# Patient Record
Sex: Female | Born: 1985 | Race: White | Hispanic: No | Marital: Married | Smoking: Never smoker
Health system: Southern US, Community
[De-identification: ages and names within clinical notes are randomized; demographics above are authoritative.]

## PROBLEM LIST (undated history)

## (undated) DIAGNOSIS — C4359 Malignant melanoma of other part of trunk: Secondary | ICD-10-CM

## (undated) DIAGNOSIS — E669 Obesity, unspecified: Secondary | ICD-10-CM

## (undated) DIAGNOSIS — F32A Depression, unspecified: Secondary | ICD-10-CM

## (undated) DIAGNOSIS — F329 Major depressive disorder, single episode, unspecified: Secondary | ICD-10-CM

## (undated) DIAGNOSIS — E079 Disorder of thyroid, unspecified: Secondary | ICD-10-CM

## (undated) DIAGNOSIS — N946 Dysmenorrhea, unspecified: Secondary | ICD-10-CM

## (undated) HISTORY — DX: Major depressive disorder, single episode, unspecified: F32.9

## (undated) HISTORY — DX: Obesity, unspecified: E66.9

## (undated) HISTORY — DX: Depression, unspecified: F32.A

## (undated) HISTORY — DX: Dysmenorrhea, unspecified: N94.6

## (undated) HISTORY — PX: CHOLECYSTECTOMY: SHX55

## (undated) HISTORY — DX: Malignant melanoma of other part of trunk: C43.59

## (undated) HISTORY — PX: MOHS SURGERY: SUR867

## (undated) HISTORY — DX: Disorder of thyroid, unspecified: E07.9

---
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
## 2007-03-29 ENCOUNTER — Ambulatory Visit (HOSPITAL_BASED_OUTPATIENT_CLINIC_OR_DEPARTMENT_OTHER): Admission: RE | Admit: 2007-03-29 | Discharge: 2007-03-29 | Payer: Self-pay | Admitting: Surgery

## 2007-04-04 ENCOUNTER — Ambulatory Visit (HOSPITAL_COMMUNITY): Admission: RE | Admit: 2007-04-04 | Discharge: 2007-04-04 | Payer: Self-pay | Admitting: Surgery

## 2007-04-05 ENCOUNTER — Encounter: Admission: RE | Admit: 2007-04-05 | Discharge: 2007-04-05 | Payer: Self-pay | Admitting: Surgery

## 2007-04-30 ENCOUNTER — Ambulatory Visit: Payer: Self-pay | Admitting: Internal Medicine

## 2007-08-22 ENCOUNTER — Other Ambulatory Visit: Admission: RE | Admit: 2007-08-22 | Discharge: 2007-08-22 | Payer: Self-pay | Admitting: Internal Medicine

## 2008-02-14 ENCOUNTER — Ambulatory Visit: Payer: Self-pay | Admitting: Internal Medicine

## 2008-06-05 ENCOUNTER — Ambulatory Visit: Payer: Self-pay | Admitting: Internal Medicine

## 2008-08-23 ENCOUNTER — Ambulatory Visit: Payer: Self-pay | Admitting: Internal Medicine

## 2008-08-23 ENCOUNTER — Other Ambulatory Visit: Admission: RE | Admit: 2008-08-23 | Discharge: 2008-08-23 | Payer: Self-pay | Admitting: Internal Medicine

## 2008-09-21 ENCOUNTER — Ambulatory Visit: Payer: Self-pay | Admitting: Internal Medicine

## 2009-04-09 ENCOUNTER — Ambulatory Visit: Payer: Self-pay | Admitting: Internal Medicine

## 2009-05-13 ENCOUNTER — Ambulatory Visit: Payer: Self-pay | Admitting: Internal Medicine

## 2009-10-03 ENCOUNTER — Ambulatory Visit: Payer: Self-pay | Admitting: Internal Medicine

## 2009-10-03 ENCOUNTER — Other Ambulatory Visit: Admission: RE | Admit: 2009-10-03 | Discharge: 2009-10-03 | Payer: Self-pay | Admitting: Internal Medicine

## 2009-12-19 ENCOUNTER — Ambulatory Visit: Payer: Self-pay | Admitting: Internal Medicine

## 2010-04-03 ENCOUNTER — Ambulatory Visit: Payer: Self-pay | Admitting: Internal Medicine

## 2010-05-05 ENCOUNTER — Ambulatory Visit
Admission: RE | Admit: 2010-05-05 | Discharge: 2010-05-05 | Payer: Self-pay | Source: Home / Self Care | Attending: Internal Medicine | Admitting: Internal Medicine

## 2010-05-05 ENCOUNTER — Ambulatory Visit: Admit: 2010-05-05 | Payer: Self-pay | Admitting: Internal Medicine

## 2010-05-17 ENCOUNTER — Encounter: Payer: Self-pay | Admitting: Internal Medicine

## 2010-09-09 NOTE — Procedures (Signed)
NAME:  Wendy Terrell, Wendy Terrell NO.:  [id number]   MEDICAL RECORD NO.:  [id number]          PATIENT TYPE:  OUT   LOCATION:  SLEEP CENTER                 FACILITY:  [facility]   PHYSICIAN:  Clinton D. Maple Hudson, MD, FCCP, FACPDATE OF BIRTH:  06-30[birth year]1987   DATE OF STUDY:  03/29/2007                            NOCTURNAL POLYSOMNOGRAM   REFERRING PHYSICIAN:  Thornton Park. Daphine Deutscher, MD   INDICATION FOR STUDY:  Hypersomnia with sleep apnea.  BMI 47, weight 311  pounds, height 68 inches, neck 15 inches.   EPWORTH SLEEPINESS SCORE:  12/24   MEDICATIONS:  Home medications listed and reviewed.  No bedtime  medication was taken.   SLEEP ARCHITECTURE:  Total sleep time 393 minutes with sleep efficiency  91%.  Stage I was 8%, stage II 67%, stage III 10%, REM 13% of total  sleep time.  Sleep latency 9 minutes, REM latency 217 minutes.  Awake  after sleep onset 25 minutes.  Arousal index 12.8.   RESPIRATORY DATA:  Diagnostic NPSG protocol requested.  Apnea/hypopnea  index (AHI) 1.5 obstructive events per hour which is normal.  Events  included 1 central apnea, 9 hypopneas.  Events were nonpositional with  REM AHI 2.3.  Respiratory disturbance index (RDI) counting all events  impacting sleep was 6.9 which may reflect mild obstructive sleep  apnea/hypopnea syndrome (normal range 0-5 per hour).   OXYGEN DATA:  Moderate snoring with oxygen desaturation to a nadir of  87%.  Mean oxygen saturation through the study was 95.9% on room air.   CARDIAC DATA:  Normal sinus rhythm.   MOVEMENT-PARASOMNIA:  No significant limb movement disturbance.  No  bathroom trips.   IMPRESSIONS-RECOMMENDATIONS:  1. Minimal obstructive sleep apnea/hypopnea syndrome based on      respiratory disturbance index (RDI) 6.9 per hour (normal range 0-5      per hour).  AHI was 1.5 per hour.  Moderately loud snoring with      oxygen desaturation to a nadir of 87%.  2. Scores in this range are not usually addressed with CPAP.   If      necessary at time of surgery, suggest either an autotitration      system or empiric CPAP pressure at 10 CWP.      Clinton D. Maple Hudson, MD, [facility], FACP  Diplomate, Biomedical engineer of Sleep Medicine  Electronically Signed     CDY/MEDQ  D:  04/03/2007 15:33:26  T:  04/04/2007 00:14:54  Job:  [id number]

## 2010-09-12 NOTE — Op Note (Signed)
NAME:  TELINA, KLECKLEY NO.:  [id number]   MEDICAL RECORD NO.:  [id number]          PATIENT TYPE:  AMB   LOCATION:  DAY                          FACILITY:  [facility]   PHYSICIAN:  Adolph Pollack, M.D.DATE OF BIRTH:  December 17, 1985   DATE OF PROCEDURE:  01/25/2006  DATE OF DISCHARGE:                                 OPERATIVE REPORT   PREOP DIAGNOSIS:  Symptomatic cholelithiasis.   POSTOPERATIVE DIAGNOSIS:  Symptomatic cholelithiasis.   PROCEDURE:  Laparoscopic cholecystectomy with intraoperative cholangiogram.   SURGEON:  Adolph Pollack, M.D.   ASSISTANT:  Sheppard Plumber. Earlene Plater, M.D.   ANESTHESIA:  General.   INDICATIONS:  This 25 year old female has been having episodic epigastric  pain radiating to the back with nausea and vomiting.  This tends to be  exacerbated by a greasy or spicy meal.  Liver function tests are normal.  She has cholelithiasis and a HIDA scan that did not show filling of the  gallbladder.  She now presents for elective laparoscopic cholecystectomy.  We have gone over the procedure and the risks and aftercare preoperatively.   TECHNIQUE:  She was seen in the holding area, then brought to the operating  room, placed supine on the operating table and a general anesthetic was  administered.  The abdominal wall was sterilely prepped and draped.  A  supraumbilical small incision was made through the skin, subcutaneous  tissue, fascia, and peritoneum.  A pursestring suture of #0 Vicryl was  placed around the fascial edges.  A Hassan trocar was introduced into the  peritoneal cavity; and a pneumoperitoneum was created by insufflation of CO2  gas.   Following this a laparoscope was introduced.  The liver looked within normal  limits.  She was placed in the reverse Trendelenburg position with the right  side tilted slightly up.  An 11-mm trocar was placed through an epigastric  incision; and two 5-mm trocars were placed in the right mid lateral  abdomen.  The fundus of the gallbladder was grasped and retracted toward the right  shoulder.  The infundibulum was grasped and using blunt dissection it was  mobilized.  I then isolated the cystic duct and created a window around it.  A clip was placed at the cystic duct gallbladder junction; and a small  incision made in the cystic duct; and bile was milked back.  A  cholangiocatheter was passed through the anterior abdominal wall and placed  into the cystic duct.  Following this a cholangiogram was performed.   Under real time fluoroscopy dilute contrast material was injected into the  cystic duct.  The common bile duct filled promptly and drained promptly.  The common hepatic duct was seen as well as the initial bifurcation; and no  obvious obstructing lesions were noted.   Following this the cholangiocatheter was removed, the cystic duct was  clipped three times proximally and divided.  An anterior branch of the  cystic artery was identified close to the gallbladder; and a window created  around it.  It was clipped and divided.  The posterior branch of the cystic  artery  was identified, clipped, and divided.  The gallbladder was then  dissected free from the liver using electrocautery.  A small amount of bile  spillage occurred, but no stones were spilled.  This was placed in an  Endopouch bag.   I then copiously irrigated out the gallbladder fossa.  Bleeding points were  controlled with electrocautery.  At this point no bleeding or bile leak was  noted.  I then removed the gallbladder in the Endopouch bag through the  supraumbilical port and closed the supraumbilical fascial defect under  laparoscopic vision by tightening up and tying down the pursestring suture.  The perihepatic irrigation fluid was evacuated.  The remaining trocars were  removed; and the pneumoperitoneum was released.   The skin incisions were closed with 4-0 Monocryl subcuticular stitches  followed by  Steri-Strips and sterile dressings.  She tolerated the procedure  without apparent complications; and was taken to recovery in satisfactory  condition.      Adolph Pollack, M.D.  Electronically Signed     TJR/MEDQ  D:  01/25/2006  T:  01/26/2006  Job:  [id number]

## 2010-11-07 ENCOUNTER — Encounter: Payer: Self-pay | Admitting: Internal Medicine

## 2010-11-11 ENCOUNTER — Ambulatory Visit (INDEPENDENT_AMBULATORY_CARE_PROVIDER_SITE_OTHER): Payer: BC Managed Care – PPO | Admitting: Internal Medicine

## 2010-11-11 ENCOUNTER — Encounter: Payer: Self-pay | Admitting: Internal Medicine

## 2010-11-11 VITALS — BP 109/64 | HR 68 | Temp 98.7°F | Ht 66.0 in | Wt 298.0 lb

## 2010-11-11 DIAGNOSIS — E611 Iron deficiency: Secondary | ICD-10-CM

## 2010-11-11 DIAGNOSIS — I871 Compression of vein: Secondary | ICD-10-CM

## 2010-11-11 DIAGNOSIS — E119 Type 2 diabetes mellitus without complications: Secondary | ICD-10-CM

## 2010-11-11 DIAGNOSIS — E669 Obesity, unspecified: Secondary | ICD-10-CM

## 2010-11-11 DIAGNOSIS — E039 Hypothyroidism, unspecified: Secondary | ICD-10-CM

## 2010-11-11 LAB — TSH: TSH: 4.795 u[IU]/mL — ABNORMAL HIGH (ref 0.350–4.500)

## 2010-11-11 NOTE — Patient Instructions (Signed)
Take iron sulfate 325 mg twice daily for mild iron deficiency. Continue with thyroid replacement therapy.

## 2010-11-11 NOTE — Progress Notes (Signed)
  Subjective:    Patient ID: Wendy Terrell, female    DOB: 08-02-1985, 25 y.o.   MRN: [id number]  HPI  patient in today for followup of hypothyroidism. Is on Synthroid 0.05 mg daily. She has a new baby boy. She is not breast-feeding. She  had a C-section after being induced for post dates. Has not been able to lose much weight since having the baby. At one point she was considering having bariatric surgery but has put that on hold for now. Says that prior to becoming pregnant, she did Weight Watchers and lost a good deal of weight. She brings in some lab work from the obstetrician that indicate she has low normal iron level. Says she has heavy menses. Have recommended iron sulfate 325 mg twice daily. She is not anemic and her MCV is normal. Total iron level was 51. Hemoglobin was 12.1 g. Iron-binding capacity was normal. Ferritin was slightly low at 12 normal being between 13 and 150. Says that she had considerable amount of dependent edema after giving birth but finally went away with Lasix after a couple of months. Strong family history of diabetes. Never had pregnancy-induced hypertension nor glucose intolerance during her pregnancy.    Review of Systems     Objective:   Physical Exam  neck is supple without thyromegaly; chest clear; cardiac exam regular rate and rhythm; extremities without edema.        Assessment & Plan:  Hypothyroidism  Obesity  History of dependent edema status post delivery  TSH drawn today, screening hemoglobin A1c with strong family history of diabetes and obesity, check basic metabolic panel with history of edema. Return in 6 months for followup on hypothyroidism and any other issues. Says she's mildly depressed about her weight but not on any antidepressant therapy at this point in time. Now has IUD for birth control

## 2010-11-12 LAB — BASIC METABOLIC PANEL
BUN: 17 mg/dL (ref 6–23)
CO2: 26 mEq/L (ref 19–32)
Calcium: 9.2 mg/dL (ref 8.4–10.5)
Chloride: 104 mEq/L (ref 96–112)
Creat: 0.62 mg/dL (ref 0.50–1.10)
Glucose, Bld: 63 mg/dL — ABNORMAL LOW (ref 70–99)
Potassium: 4.3 mEq/L (ref 3.5–5.3)
Sodium: 141 mEq/L (ref 135–145)

## 2010-11-12 LAB — HEMOGLOBIN A1C
Hgb A1c MFr Bld: 5.1 % (ref <5.7)
Mean Plasma Glucose: 100 mg/dL (ref <117)

## 2010-11-14 ENCOUNTER — Encounter: Payer: Self-pay | Admitting: Internal Medicine

## 2011-01-22 ENCOUNTER — Ambulatory Visit: Payer: BC Managed Care – PPO | Admitting: Internal Medicine

## 2011-02-12 ENCOUNTER — Ambulatory Visit: Payer: BC Managed Care – PPO | Admitting: Internal Medicine

## 2011-05-18 ENCOUNTER — Ambulatory Visit (INDEPENDENT_AMBULATORY_CARE_PROVIDER_SITE_OTHER): Payer: BC Managed Care – PPO | Admitting: Internal Medicine

## 2011-05-18 ENCOUNTER — Encounter: Payer: Self-pay | Admitting: Internal Medicine

## 2011-05-18 VITALS — BP 136/82 | HR 76 | Temp 98.1°F | Ht 67.0 in | Wt 326.0 lb

## 2011-05-18 DIAGNOSIS — Z23 Encounter for immunization: Secondary | ICD-10-CM

## 2011-05-18 DIAGNOSIS — E039 Hypothyroidism, unspecified: Secondary | ICD-10-CM

## 2011-05-18 DIAGNOSIS — F4321 Adjustment disorder with depressed mood: Secondary | ICD-10-CM

## 2011-05-18 DIAGNOSIS — F32A Depression, unspecified: Secondary | ICD-10-CM | POA: Insufficient documentation

## 2011-05-18 DIAGNOSIS — F329 Major depressive disorder, single episode, unspecified: Secondary | ICD-10-CM

## 2011-05-18 LAB — TSH: TSH: 3.091 u[IU]/mL (ref 0.350–4.500)

## 2011-05-18 NOTE — Patient Instructions (Signed)
TSH has been checked today. We will notify you with regard to current dose of Synthroid. Continue Wellbutrin for depression as prescribed by GYN. Consider grief counseling. Return in 6 months for physical exam without Pap smear and pelvic exam.

## 2011-05-18 NOTE — Progress Notes (Signed)
  Subjective:    Patient ID: Wendy Terrell, female    DOB: 1986/04/25, 26 y.o.   MRN: [id number]  HPI 26 year old morbidly obese white female with hypothyroidism in today for six-month recheck. TSH drawn. She lost her father a couple of weeks ago due to complications from heart surgery. He had a history of Hodgkin's disease as a young man and apparently had developed heart disease and valve issues. He had a rocky course in the hospital and patient says he was taken off life support by his wife. Patient has a 10-month-old son. Says she had some postpartum depression issues and gynecologist in [place] started her on Wellbutrin XL 300 mg daily recently. May have some grief issues that she has not resolved. Says half siblings are fighting with her stepmother over father's estate. She never had a great relationship with her father. She said he was frequently disappointed with her especially her weight. She has considered bariatric surgery in the past but has not done that. She became pregnant soon after she considered that. Did not take influenza immunization this season. Tdap was given today.    Review of Systems     Objective:   Physical Exam no thyromegaly; chest clear; cardiac exam regular rate and rhythm        Assessment & Plan:  Hypothyroidism  Depression  Grief reaction  Morbid obesity weight greater than 300 pounds  Plan: Return in 6 months for PE and fasting labs without Pap smear. Tdap given today. Has gynecologist in [place]. Consider grief counseling.

## 2011-07-12 ENCOUNTER — Other Ambulatory Visit: Payer: Self-pay | Admitting: Internal Medicine

## 2011-11-16 ENCOUNTER — Ambulatory Visit (INDEPENDENT_AMBULATORY_CARE_PROVIDER_SITE_OTHER): Payer: BC Managed Care – PPO | Admitting: Internal Medicine

## 2011-11-16 ENCOUNTER — Encounter: Payer: Self-pay | Admitting: Internal Medicine

## 2011-11-16 VITALS — BP 108/72 | HR 72 | Temp 97.1°F | Ht 66.0 in | Wt 333.5 lb

## 2011-11-16 DIAGNOSIS — Z131 Encounter for screening for diabetes mellitus: Secondary | ICD-10-CM

## 2011-11-16 DIAGNOSIS — E039 Hypothyroidism, unspecified: Secondary | ICD-10-CM

## 2011-11-16 DIAGNOSIS — Z7182 Exercise counseling: Secondary | ICD-10-CM

## 2011-11-16 DIAGNOSIS — Z8659 Personal history of other mental and behavioral disorders: Secondary | ICD-10-CM

## 2011-11-16 LAB — HEMOGLOBIN A1C
Hgb A1c MFr Bld: 5.2 % (ref <5.7)
Mean Plasma Glucose: 103 mg/dL (ref <117)

## 2011-11-16 LAB — TSH: TSH: 1.272 u[IU]/mL (ref 0.350–4.500)

## 2011-11-16 NOTE — Patient Instructions (Addendum)
Decrease calories to 1800 daily. Make sure you're well hydrated during hot weather, in particular before starting a run. Return in 6 months for physical examination.

## 2011-11-16 NOTE — Progress Notes (Signed)
  Subjective:    Patient ID: Wendy Terrell, female    DOB: [birth year], 26 y.o.   MRN: [id number]  HPI 26 year old morbidly obese white female in today for followup of hypothyroidism, depression, and obesity. Unfortunately she's gained about 7 pounds since her last visit. Recently she started running. She's training for [employer] marathon in 2023/04/23. Her father passed away of complications from valve replacement surgery at [facility]. He became ill suddenly but had a history of Hodgkin's disease and had chemotherapy with radiation therapy in college. Doctors at [facility] told her that his heart condition was likely related to that treatment years ago. Patient has had some issues surrounding not getting much attention from her father over the years. She seems much more at peace with that nail. Says running makes her feel free and she's excited about this. Says everything is good. She's come off antidepressant therapy. She was surprised to learn that she gained some weight. Recently she was running and felt some spots in front of her eyes. Her mother took her blood sugar and it was a bit low but she had very little to a 10 day. Explained to her it would be important to have enough calories before running, protein intake that is adequate and stay well-hydrated particularly in this hot humid weather. We have agreed to reassess her weight in 6 months. She is currently consuming 2400 calories. I think that may be a bit too much for her metabolism. Have asked her to reduce it to 1800 calories. She's asking about a supplement someone is selling containing high doses of B12 and B6. It also contains 120 mg of caffeine. Explained to her that caffeine can cause some fluid retention. She says that this gives her energy to run. Would prefer that she not take the supplement. Says she is walking 3.1 miles on the treadmill daily. This is most excited upsetting her about anything in a number of years. Her son is now 64 months  old and she says he is quite active.    Review of Systems     Objective:   Physical Exam neck is supple without thyromegaly; skin is warm and dry; chest clear to auscultation; cardiac exam regular rate and rhythm normal S1 and S2; extremities without edema. Affect is normal and actually brighter than I have seen in the past.        Assessment & Plan:  Morbid obesity  Hypothyroidism  History of depression- now off antidepressant therapy  Plan: Patient will return in 6 months for physical examination. TSH and hemoglobin A1c drawn today. There is a strong family history of diabetes mellitus in her mother, her uncle, her maternal grandparents

## 2012-05-06 ENCOUNTER — Other Ambulatory Visit: Payer: BC Managed Care – PPO | Admitting: Internal Medicine

## 2012-05-06 DIAGNOSIS — E039 Hypothyroidism, unspecified: Secondary | ICD-10-CM

## 2012-05-06 DIAGNOSIS — E669 Obesity, unspecified: Secondary | ICD-10-CM

## 2012-05-06 DIAGNOSIS — Z0000 Encounter for general adult medical examination without abnormal findings: Secondary | ICD-10-CM

## 2012-05-06 LAB — CBC WITH DIFFERENTIAL/PLATELET
Basophils Absolute: 0 K/uL (ref 0.0–0.1)
Basophils Relative: 0 % (ref 0–1)
Eosinophils Absolute: 0.1 K/uL (ref 0.0–0.7)
Eosinophils Relative: 1 % (ref 0–5)
HCT: 40.7 % (ref 36.0–46.0)
Hemoglobin: 14 g/dL (ref 12.0–15.0)
Lymphocytes Relative: 23 % (ref 12–46)
Lymphs Abs: 1.6 K/uL (ref 0.7–4.0)
MCH: 28.5 pg (ref 26.0–34.0)
MCHC: 34.4 g/dL (ref 30.0–36.0)
MCV: 82.7 fL (ref 78.0–100.0)
Monocytes Absolute: 0.5 K/uL (ref 0.1–1.0)
Monocytes Relative: 7 % (ref 3–12)
Neutro Abs: 4.9 K/uL (ref 1.7–7.7)
Neutrophils Relative %: 69 % (ref 43–77)
Platelets: 224 K/uL (ref 150–400)
RBC: 4.92 MIL/uL (ref 3.87–5.11)
RDW: 13.4 % (ref 11.5–15.5)
WBC: 7.1 K/uL (ref 4.0–10.5)

## 2012-05-06 LAB — COMPREHENSIVE METABOLIC PANEL
ALT: 12 U/L (ref 0–35)
AST: 13 U/L (ref 0–37)
Albumin: 4.2 g/dL (ref 3.5–5.2)
Alkaline Phosphatase: 88 U/L (ref 39–117)
BUN: 9 mg/dL (ref 6–23)
CO2: 28 mEq/L (ref 19–32)
Calcium: 9 mg/dL (ref 8.4–10.5)
Chloride: 103 mEq/L (ref 96–112)
Creat: 0.61 mg/dL (ref 0.50–1.10)
Glucose, Bld: 75 mg/dL (ref 70–99)
Potassium: 4.3 mEq/L (ref 3.5–5.3)
Sodium: 139 mEq/L (ref 135–145)
Total Bilirubin: 0.4 mg/dL (ref 0.3–1.2)
Total Protein: 6.7 g/dL (ref 6.0–8.3)

## 2012-05-06 LAB — LIPID PANEL
Cholesterol: 171 mg/dL (ref 0–200)
HDL: 45 mg/dL
LDL Cholesterol: 114 mg/dL — ABNORMAL HIGH (ref 0–99)
Total CHOL/HDL Ratio: 3.8 ratio
Triglycerides: 61 mg/dL
VLDL: 12 mg/dL (ref 0–40)

## 2012-05-06 LAB — TSH: TSH: 3.564 u[IU]/mL (ref 0.350–4.500)

## 2012-05-07 LAB — VITAMIN D 25 HYDROXY (VIT D DEFICIENCY, FRACTURES): Vit D, 25-Hydroxy: 30 ng/mL (ref 30–89)

## 2012-05-09 ENCOUNTER — Other Ambulatory Visit: Payer: BC Managed Care – PPO | Admitting: Internal Medicine

## 2012-05-10 ENCOUNTER — Encounter: Payer: Self-pay | Admitting: Internal Medicine

## 2012-05-10 ENCOUNTER — Ambulatory Visit (INDEPENDENT_AMBULATORY_CARE_PROVIDER_SITE_OTHER): Payer: BC Managed Care – PPO | Admitting: Internal Medicine

## 2012-05-10 VITALS — BP 104/82 | HR 64 | Temp 98.5°F | Ht 68.0 in | Wt 329.0 lb

## 2012-05-10 DIAGNOSIS — E669 Obesity, unspecified: Secondary | ICD-10-CM

## 2012-05-10 DIAGNOSIS — R632 Polyphagia: Secondary | ICD-10-CM

## 2012-05-10 DIAGNOSIS — F509 Eating disorder, unspecified: Secondary | ICD-10-CM

## 2012-05-10 DIAGNOSIS — Z0000 Encounter for general adult medical examination without abnormal findings: Secondary | ICD-10-CM

## 2012-05-10 DIAGNOSIS — E039 Hypothyroidism, unspecified: Secondary | ICD-10-CM

## 2012-05-10 DIAGNOSIS — F329 Major depressive disorder, single episode, unspecified: Secondary | ICD-10-CM

## 2012-05-10 DIAGNOSIS — K5900 Constipation, unspecified: Secondary | ICD-10-CM

## 2012-05-10 LAB — POCT URINALYSIS DIPSTICK
Bilirubin, UA: NEGATIVE
Blood, UA: NEGATIVE
Glucose, UA: NEGATIVE
Ketones, UA: NEGATIVE
Leukocytes, UA: NEGATIVE
Nitrite, UA: NEGATIVE
Protein, UA: NEGATIVE
Spec Grav, UA: 1.01
Urobilinogen, UA: NEGATIVE
pH, UA: 7.5

## 2012-05-10 NOTE — Progress Notes (Signed)
Subjective:    Patient ID: Wendy Terrell, female    DOB: 08-Mar-1986, 27 y.o.   MRN: [id number]  HPI 27 year old [place] female with history of binge eating but not purging currently going to counseling for that weekly here in [place]. Any emotion seems to trigger that then feels bad about self. It could anger, lonliness, boredom triggering the overeating. Might eat a whole pizza or bag of potato chips. Frustration with husband triggers the eating. 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 Need to obtain path report.  Family history: Father passed away from complications of heart disease and had valve replacement complications. He had had Hodgkin's disease at an early age. Apparently also had history of melanoma on his back. Patient says half-sister has history of melanoma on her back. Mother with history of hypothyroidism and obesity. Mother has diabetes mellitus.  Maternal uncle with diabetes mellitus. Maternal grandparents with diabetes mellitus and hypertension.  Social history: She is married. 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 Has a nearly 40-year-old son. High school graduate. Attended [employer] but did not graduate. Long-standing history of obesity. Has considered bariatric surgery several years ago.  Has GYN physician in [place]. Currently has an IUD Mirena but will be changing to a ParaGard IUD without hormone in the near future. She is hoping that that will help her weight loss.  Does not smoke.    Review of Systems  Constitutional: Positive for fatigue.  HENT: Negative.   Eyes: Negative.   Respiratory: Negative.   Cardiovascular: Negative.   Gastrointestinal:       Constipation  Genitourinary: Negative.   Musculoskeletal: Negative.   Neurological: Negative.   Hematological: Negative.   Psychiatric/Behavioral: Positive for dysphoric mood.       Objective:   Physical Exam  Vitals  reviewed. Constitutional: She is oriented to person, place, and time. She appears well-developed and well-nourished. No distress.  HENT:  Head: Normocephalic and atraumatic.  Right Ear: External ear normal.  Left Ear: External ear normal.  Nose: Nose normal.  Mouth/Throat: Oropharynx is clear and moist. No oropharyngeal exudate.  Eyes: Conjunctivae normal and EOM are normal. Pupils are equal, round, and reactive to light. No scleral icterus.  Neck: Neck supple. No JVD present. No thyromegaly present.  Cardiovascular: Normal rate, regular rhythm, normal heart sounds and intact distal pulses.   No murmur heard. Pulmonary/Chest: Effort normal and breath sounds normal. No respiratory distress. She has no wheezes. She has no rales.       Breasts normal female  Abdominal: Soft. Bowel sounds are normal. She exhibits no distension and no mass. There is no tenderness. There is no rebound and no guarding.  Genitourinary:       Dr. Cathie Beams in [place] at [facility]. Pap has been normal has Mirena but going to [place] soon to see if no hormone IUD helps weight loss.  Musculoskeletal: She exhibits no edema.  Lymphadenopathy:    She has no cervical adenopathy.  Neurological: She is alert and oriented to person, place, and time. She has normal reflexes. No cranial nerve deficit. Coordination normal.  Skin: Skin is warm and dry. She is not diaphoretic.       Scar on mid back status post excision of possible melanoma several years ago  Psychiatric: She has a normal mood and affect. Her behavior is normal. Judgment and thought content normal.  Assessment & Plan:  Depression-change Wellbutrin SR 200 mg which she's been taking daily instead of twice a day to Wellbutrin XL 300 mg daily  Hypothyroidism-TSH a bit elevated. Change Synthroid to 0.1 mg daily and recheck in 6 months  Obesity-encouraged diet exercise and weight loss  Possible eating disorder-continue  counseling  Patient relates history of melanoma on her back being removed by Dr. Irene Limbo at [facility]. This was several years ago. We do not have path  Report. Obtain path report. She is  seen yearly there for mole check.  Recent report of constipation which improved with increasing water intake daily.  Plan: Return in 6 months for office visit and TSH on increased dose of Synthroid a new dose of Wellbutrin.

## 2012-05-10 NOTE — Patient Instructions (Addendum)
Increase Synthroid to 0.1 mg daily and recheck in 6 months. Change Wellbutrin XL to 300 mg daily. Continue to see counselor.

## 2012-08-24 ENCOUNTER — Other Ambulatory Visit: Payer: Self-pay | Admitting: Dermatology

## 2012-09-27 ENCOUNTER — Other Ambulatory Visit: Payer: Self-pay | Admitting: Dermatology

## 2012-09-27 ENCOUNTER — Other Ambulatory Visit: Payer: Self-pay

## 2012-09-27 MED ORDER — SYNTHROID 75 MCG PO TABS: 75.0000 ug | ORAL_TABLET | Freq: Every day | ORAL | Status: DC

## 2012-09-27 MED ORDER — BUPROPION HCL ER (XL) 300 MG PO TB24: 300.0000 mg | ORAL_TABLET | Freq: Every day | ORAL | Status: DC

## 2012-11-03 ENCOUNTER — Other Ambulatory Visit: Payer: BC Managed Care – PPO | Admitting: Internal Medicine

## 2012-11-03 DIAGNOSIS — E039 Hypothyroidism, unspecified: Secondary | ICD-10-CM

## 2012-11-03 LAB — TSH: TSH: 2.935 u[IU]/mL (ref 0.350–4.500)

## 2012-11-07 ENCOUNTER — Encounter: Payer: Self-pay | Admitting: Internal Medicine

## 2012-11-07 ENCOUNTER — Ambulatory Visit (INDEPENDENT_AMBULATORY_CARE_PROVIDER_SITE_OTHER): Payer: BC Managed Care – PPO | Admitting: Internal Medicine

## 2012-11-07 VITALS — BP 130/78 | HR 68 | Temp 97.9°F | Wt 333.0 lb

## 2012-11-07 DIAGNOSIS — E669 Obesity, unspecified: Secondary | ICD-10-CM

## 2012-11-07 DIAGNOSIS — F329 Major depressive disorder, single episode, unspecified: Secondary | ICD-10-CM

## 2012-11-07 DIAGNOSIS — E039 Hypothyroidism, unspecified: Secondary | ICD-10-CM

## 2012-11-07 MED ORDER — SYNTHROID 75 MCG PO TABS: 75.0000 ug | ORAL_TABLET | Freq: Every day | ORAL | Status: DC

## 2012-11-07 MED ORDER — BUPROPION HCL ER (XL) 300 MG PO TB24: 300.0000 mg | ORAL_TABLET | Freq: Every day | ORAL | Status: DC

## 2012-11-07 NOTE — Progress Notes (Signed)
  Subjective:    Patient ID: Wendy Terrell, female    DOB: 08/23/1985, 27 y.o.   MRN: [id number]  HPI 27 year old white female who has struggled most of her life with obesity which she inherited from  her mother's  side of the family in today for six-month recheck of hypothyroidism. Is on Synthroid 0.075 mg daily. TSH is within normal limits. She's also Wellbutrin for depression. She sees a Veterinary surgeon. 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 Husband works third shift. She has a 65-year-old child. Says she might have another child in the next year or so. Has thought about lap band surgery but has heard there have been some problems with it recently so she is a bit concerned about it. She has thought about gastric bypass.    Review of Systems     Objective:   Physical Exam No thyromegaly. Affect is appropriate. Says sometimes she feels depressed and doesn't care what she eats. Other time she tries to watch her diet.       Assessment & Plan:  Obesity-continue with diet and exercise  Depression-treated with Wellbutrin  Hypothyroidism-stable on Synthroid 0.075 mg daily  Plan: Return in 6 months for physical examination. She now has a ParaGard IUD device instead of Mirena which contain hormones. However she saw no change in her weight when she switched to a hormone free IUD.

## 2012-11-07 NOTE — Patient Instructions (Addendum)
Continue same medications and return in 6 months for physical exam. 

## 2013-02-01 ENCOUNTER — Other Ambulatory Visit: Payer: Self-pay | Admitting: Internal Medicine

## 2013-04-24 ENCOUNTER — Other Ambulatory Visit: Payer: Self-pay | Admitting: Internal Medicine

## 2013-05-01 ENCOUNTER — Ambulatory Visit (INDEPENDENT_AMBULATORY_CARE_PROVIDER_SITE_OTHER): Payer: BC Managed Care – PPO | Admitting: Internal Medicine

## 2013-05-01 ENCOUNTER — Encounter: Payer: Self-pay | Admitting: Internal Medicine

## 2013-05-01 VITALS — BP 122/84 | Temp 98.1°F | Wt 326.0 lb

## 2013-05-01 DIAGNOSIS — H6690 Otitis media, unspecified, unspecified ear: Secondary | ICD-10-CM

## 2013-05-01 DIAGNOSIS — H6693 Otitis media, unspecified, bilateral: Secondary | ICD-10-CM

## 2013-05-01 DIAGNOSIS — J069 Acute upper respiratory infection, unspecified: Secondary | ICD-10-CM

## 2013-05-01 MED ORDER — LEVOFLOXACIN 500 MG PO TABS: 500.0000 mg | ORAL_TABLET | Freq: Every day | ORAL | Status: DC

## 2013-05-01 NOTE — Patient Instructions (Signed)
Take Levaquin as directed for 10 days.

## 2013-05-01 NOTE — Progress Notes (Signed)
   Subjective:    Patient ID: Wendy Terrell, female    DOB: Mar 18, 1986, 28 y.o.   MRN: [id number]  HPI Patient in today with URI symptoms. Had no fever but malaise, chills, myalgias. This started around Christmas day. 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 Mother and patient's son have had similar illnesses.    Review of Systems     Objective:   Physical Exam TMs are thickened full and red bilaterally. Pharynx only slightly injected. Neck is supple without significant adenopathy. Chest clear.        Assessment & Plan:  Bilateral otitis media  Acute URI  Plan: Levaquin 500 milligrams daily for 10 days.

## 2013-05-16 ENCOUNTER — Other Ambulatory Visit: Payer: BC Managed Care – PPO | Admitting: Internal Medicine

## 2013-05-16 ENCOUNTER — Ambulatory Visit: Payer: BC Managed Care – PPO | Admitting: Internal Medicine

## 2013-05-18 ENCOUNTER — Encounter: Payer: BC Managed Care – PPO | Admitting: Internal Medicine

## 2013-05-19 ENCOUNTER — Other Ambulatory Visit: Payer: BC Managed Care – PPO | Admitting: Internal Medicine

## 2013-05-22 ENCOUNTER — Encounter: Payer: Self-pay | Admitting: Internal Medicine

## 2013-05-22 ENCOUNTER — Ambulatory Visit (INDEPENDENT_AMBULATORY_CARE_PROVIDER_SITE_OTHER): Payer: BC Managed Care – PPO | Admitting: Internal Medicine

## 2013-05-22 ENCOUNTER — Other Ambulatory Visit: Payer: Self-pay | Admitting: Internal Medicine

## 2013-05-22 VITALS — BP 116/86 | HR 76 | Temp 98.7°F | Ht 67.5 in | Wt 324.0 lb

## 2013-05-22 DIAGNOSIS — Z13228 Encounter for screening for other metabolic disorders: Secondary | ICD-10-CM

## 2013-05-22 DIAGNOSIS — Z130 Encounter for screening for diseases of the blood and blood-forming organs and certain disorders involving the immune mechanism: Secondary | ICD-10-CM

## 2013-05-22 DIAGNOSIS — H6590 Unspecified nonsuppurative otitis media, unspecified ear: Secondary | ICD-10-CM

## 2013-05-22 DIAGNOSIS — F3289 Other specified depressive episodes: Secondary | ICD-10-CM

## 2013-05-22 DIAGNOSIS — H6690 Otitis media, unspecified, unspecified ear: Secondary | ICD-10-CM

## 2013-05-22 DIAGNOSIS — E039 Hypothyroidism, unspecified: Secondary | ICD-10-CM

## 2013-05-22 DIAGNOSIS — F329 Major depressive disorder, single episode, unspecified: Secondary | ICD-10-CM

## 2013-05-22 DIAGNOSIS — H65199 Other acute nonsuppurative otitis media, unspecified ear: Secondary | ICD-10-CM

## 2013-05-22 DIAGNOSIS — Z0000 Encounter for general adult medical examination without abnormal findings: Secondary | ICD-10-CM

## 2013-05-22 DIAGNOSIS — Z13220 Encounter for screening for lipoid disorders: Secondary | ICD-10-CM

## 2013-05-22 DIAGNOSIS — R632 Polyphagia: Secondary | ICD-10-CM

## 2013-05-22 DIAGNOSIS — Z1329 Encounter for screening for other suspected endocrine disorder: Secondary | ICD-10-CM

## 2013-05-22 DIAGNOSIS — H65195 Other acute nonsuppurative otitis media, recurrent, left ear: Secondary | ICD-10-CM

## 2013-05-22 DIAGNOSIS — F32A Depression, unspecified: Secondary | ICD-10-CM

## 2013-05-22 DIAGNOSIS — H6692 Otitis media, unspecified, left ear: Secondary | ICD-10-CM

## 2013-05-22 LAB — CBC WITH DIFFERENTIAL/PLATELET
Basophils Absolute: 0 10*3/uL (ref 0.0–0.1)
Basophils Relative: 0 % (ref 0–1)
Eosinophils Absolute: 0.1 10*3/uL (ref 0.0–0.7)
Eosinophils Relative: 1 % (ref 0–5)
HCT: 40.3 % (ref 36.0–46.0)
Hemoglobin: 13.4 g/dL (ref 12.0–15.0)
Lymphocytes Relative: 23 % (ref 12–46)
Lymphs Abs: 1.9 10*3/uL (ref 0.7–4.0)
MCH: 27.4 pg (ref 26.0–34.0)
MCHC: 33.3 g/dL (ref 30.0–36.0)
MCV: 82.4 fL (ref 78.0–100.0)
Monocytes Absolute: 0.4 10*3/uL (ref 0.1–1.0)
Monocytes Relative: 4 % (ref 3–12)
Neutro Abs: 6.1 10*3/uL (ref 1.7–7.7)
Neutrophils Relative %: 72 % (ref 43–77)
Platelets: 247 10*3/uL (ref 150–400)
RBC: 4.89 MIL/uL (ref 3.87–5.11)
RDW: 14.1 % (ref 11.5–15.5)
WBC: 8.5 10*3/uL (ref 4.0–10.5)

## 2013-05-22 LAB — COMPREHENSIVE METABOLIC PANEL
ALBUMIN: 4 g/dL (ref 3.5–5.2)
ALT: 12 U/L (ref 0–35)
AST: 11 U/L (ref 0–37)
Alkaline Phosphatase: 91 U/L (ref 39–117)
BUN: 9 mg/dL (ref 6–23)
CALCIUM: 8.9 mg/dL (ref 8.4–10.5)
CO2: 30 meq/L (ref 19–32)
Chloride: 102 mEq/L (ref 96–112)
Creat: 0.59 mg/dL (ref 0.50–1.10)
GLUCOSE: 79 mg/dL (ref 70–99)
POTASSIUM: 4.2 meq/L (ref 3.5–5.3)
SODIUM: 139 meq/L (ref 135–145)
TOTAL PROTEIN: 6.6 g/dL (ref 6.0–8.3)
Total Bilirubin: 0.4 mg/dL (ref 0.3–1.2)

## 2013-05-22 LAB — POCT URINALYSIS DIPSTICK
Bilirubin, UA: NEGATIVE
Blood, UA: NEGATIVE
Glucose, UA: NEGATIVE
Ketones, UA: NEGATIVE
Leukocytes, UA: NEGATIVE
Nitrite, UA: NEGATIVE
Protein, UA: NEGATIVE
Spec Grav, UA: 1.01
Urobilinogen, UA: NEGATIVE
pH, UA: 7.5

## 2013-05-22 LAB — LIPID PANEL
CHOLESTEROL: 168 mg/dL (ref 0–200)
HDL: 42 mg/dL (ref >39)
LDL CALC: 108 mg/dL — AB (ref 0–99)
TRIGLYCERIDES: 91 mg/dL (ref <150)
Total CHOL/HDL Ratio: 4 Ratio
VLDL: 18 mg/dL (ref 0–40)

## 2013-05-22 LAB — TSH: TSH: 2.55 u[IU]/mL (ref 0.350–4.500)

## 2013-05-22 MED ORDER — LEVOFLOXACIN 500 MG PO TABS: 500.0000 mg | ORAL_TABLET | Freq: Every day | ORAL | Status: DC

## 2013-05-22 MED ORDER — METHYLPREDNISOLONE ACETATE 80 MG/ML IJ SUSP
80.0000 mg | Freq: Once | INTRAMUSCULAR | Status: AC
Start: 2013-05-22 — End: 2013-05-22
  Administered 2013-05-22: 80 mg via INTRAMUSCULAR

## 2013-05-22 NOTE — Patient Instructions (Signed)
Please give reconsideration the gastric bypass surgery. Lab work is pending. Recommendations to be made regarding hypothyroidism treatment. Return in 6 months for followup and TSH. Encouraged diet and exercise.

## 2013-05-23 LAB — HEMOGLOBIN A1C
HEMOGLOBIN A1C: 5.4 % (ref <5.7)
MEAN PLASMA GLUCOSE: 108 mg/dL (ref <117)

## 2013-05-23 LAB — VITAMIN D 25 HYDROXY (VIT D DEFICIENCY, FRACTURES): Vit D, 25-Hydroxy: 25 ng/mL — ABNORMAL LOW (ref 30–89)

## 2013-05-26 ENCOUNTER — Ambulatory Visit: Payer: BC Managed Care – PPO | Admitting: Internal Medicine

## 2013-05-26 ENCOUNTER — Other Ambulatory Visit: Payer: Self-pay | Admitting: Internal Medicine

## 2013-05-30 ENCOUNTER — Telehealth: Payer: Self-pay | Admitting: Internal Medicine

## 2013-05-30 NOTE — Telephone Encounter (Signed)
She needs recheck appt here. Can she come sooner?

## 2013-05-31 NOTE — Telephone Encounter (Signed)
Patient called back and will come Thursday am.

## 2013-06-01 ENCOUNTER — Encounter: Payer: Self-pay | Admitting: Internal Medicine

## 2013-06-01 ENCOUNTER — Ambulatory Visit (INDEPENDENT_AMBULATORY_CARE_PROVIDER_SITE_OTHER): Payer: BC Managed Care – PPO | Admitting: Internal Medicine

## 2013-06-01 VITALS — BP 126/90 | HR 80 | Temp 98.5°F | Wt 324.0 lb

## 2013-06-01 DIAGNOSIS — H6690 Otitis media, unspecified, unspecified ear: Secondary | ICD-10-CM

## 2013-06-01 DIAGNOSIS — J069 Acute upper respiratory infection, unspecified: Secondary | ICD-10-CM

## 2013-06-01 DIAGNOSIS — H6692 Otitis media, unspecified, left ear: Secondary | ICD-10-CM

## 2013-06-01 MED ORDER — HYDROCOD POLST-CHLORPHEN POLST 10-8 MG/5ML PO LQCR: 5.0000 mL | Freq: Every evening | ORAL | Status: DC | PRN

## 2013-06-01 MED ORDER — FLUCONAZOLE 150 MG PO TABS: 150.0000 mg | ORAL_TABLET | Freq: Once | ORAL | Status: DC

## 2013-06-01 MED ORDER — METHYLPREDNISOLONE (PAK) 4 MG PO TABS: ORAL_TABLET | ORAL | Status: DC

## 2013-06-01 MED ORDER — DOXYCYCLINE HYCLATE 100 MG PO TABS: 100.0000 mg | ORAL_TABLET | Freq: Two times a day (BID) | ORAL | Status: DC

## 2013-06-01 NOTE — Progress Notes (Addendum)
   Subjective:    Patient ID: Wendy Terrell, female    DOB: 07/05/1985, 28 y.o.   MRN: [id number]  HPI patient was treated January 5 for respiratory infection with Levaquin 500 mg daily. She returned here January 26 for physical exam and had persistent respiratory infection symptoms and ear congestion. Was given another course of Levaquin and Depo-Medrol 80 mg IM. Says she never got completely better and a couple of days ago her son and husband came down with acute respiratory infection symptoms and she believes she has now contracted that is well. Complaining of left ear pain.    Review of Systems     Objective:   Physical Exam Left TM is dull full and red. Right TM is slightly full but not red. Pharynx slightly injected. Neck is supple without significant adenopathy. Chest clear to auscultation.       Assessment & Plan:  Acute left otitis media  Protracted upper respiratory infection  Plan: Medrol Dosepak 4 mg 6 day take as directed. Tussionex 4 ounces 1 teaspoon by mouth every 12 hours when necessary cough. Doxycycline 100 mg twice daily for 10 days. Should yeast infection develop while on antibiotics, Diflucan 150 mg tablet with one refill.

## 2013-06-01 NOTE — Patient Instructions (Signed)
Take Doxycycline 100 mg twice daily x 10 days.  Take Medrol dose pack. Take Tussionex one tsp every 12 hours as needed for cough. Diflucan 150 mg as needed for Candida vaginitis

## 2013-07-03 ENCOUNTER — Ambulatory Visit (INDEPENDENT_AMBULATORY_CARE_PROVIDER_SITE_OTHER): Payer: BC Managed Care – PPO | Admitting: Internal Medicine

## 2013-07-03 ENCOUNTER — Encounter: Payer: Self-pay | Admitting: Internal Medicine

## 2013-07-03 VITALS — BP 120/84 | HR 84 | Temp 99.1°F | Wt 324.0 lb

## 2013-07-03 DIAGNOSIS — H6690 Otitis media, unspecified, unspecified ear: Secondary | ICD-10-CM

## 2013-07-03 DIAGNOSIS — H6693 Otitis media, unspecified, bilateral: Secondary | ICD-10-CM | POA: Insufficient documentation

## 2013-07-03 DIAGNOSIS — H9190 Unspecified hearing loss, unspecified ear: Secondary | ICD-10-CM

## 2013-07-03 DIAGNOSIS — H9191 Unspecified hearing loss, right ear: Secondary | ICD-10-CM

## 2013-07-03 MED ORDER — CEFTRIAXONE SODIUM 1 G IJ SOLR
1.0000 g | Freq: Once | INTRAMUSCULAR | Status: AC
  Administered 2013-07-03: 1 g via INTRAMUSCULAR

## 2013-07-03 MED ORDER — DOXYCYCLINE HYCLATE 100 MG PO TABS: 100.0000 mg | ORAL_TABLET | Freq: Two times a day (BID) | ORAL | Status: DC

## 2013-07-03 NOTE — Addendum Note (Signed)
Addended by: Brett Canales on: 07/03/2013 04:22 PM   Modules accepted: Orders

## 2013-07-03 NOTE — Progress Notes (Signed)
   Subjective:    Patient ID: Wendy Terrell, female    DOB: 1985-08-25, 28 y.o.   MRN: [id number]  HPI  Patient was last seen February 5 and was treated with doxycycline for 10 days and a Medrol 4 mg six-day tapering dose pack. Says that she got better for 3 or 4 days and then came down with another respiratory infection last week. Says she cannot hear out of left ear. Can hear out of right ear. I think she is catching respiratory infection from her young child. She had influenza around Christmas time December 2014 and was here in early January treated with Levaquin and here in late January treated once again with Levaquin and Depo-Medrol. History of bouts of otitis media since childhood but never had one lasting this long.    Review of Systems     Objective:   Physical Exam She has rhinorrhea. Left TM is red and retracted. Right TM is full and dull and pain. Pharynx is clear. Chest clear.       Assessment & Plan:  Persistent left otitis media  Right otitis media with hearing loss  Plan: Rocephin 1 g IM. Doxycycline 100 mg by mouth twice daily for 10 days. If not better in 7 days please call us and we will get ENT referral.

## 2013-07-03 NOTE — Patient Instructions (Signed)
Take Doxycycline 100 mg twice daily. Rocephin given. If no better in 7-10 days, will make ENT referral.

## 2013-08-03 ENCOUNTER — Other Ambulatory Visit: Payer: Self-pay | Admitting: Internal Medicine

## 2013-08-04 ENCOUNTER — Ambulatory Visit (INDEPENDENT_AMBULATORY_CARE_PROVIDER_SITE_OTHER): Payer: BC Managed Care – PPO | Admitting: Internal Medicine

## 2013-08-04 ENCOUNTER — Encounter: Payer: Self-pay | Admitting: Internal Medicine

## 2013-08-04 VITALS — BP 114/62 | HR 88 | Temp 98.0°F | Wt 324.0 lb

## 2013-08-04 DIAGNOSIS — H6690 Otitis media, unspecified, unspecified ear: Secondary | ICD-10-CM

## 2013-08-04 DIAGNOSIS — L500 Allergic urticaria: Secondary | ICD-10-CM

## 2013-08-04 DIAGNOSIS — J069 Acute upper respiratory infection, unspecified: Secondary | ICD-10-CM

## 2013-08-04 DIAGNOSIS — H6692 Otitis media, unspecified, left ear: Secondary | ICD-10-CM

## 2013-08-04 MED ORDER — CEFTRIAXONE SODIUM 1 G IJ SOLR
1.0000 g | Freq: Once | INTRAMUSCULAR | Status: AC
  Administered 2013-08-04: 1 g via INTRAMUSCULAR

## 2013-08-04 MED ORDER — DOXYCYCLINE HYCLATE 100 MG PO TABS: 100.0000 mg | ORAL_TABLET | Freq: Two times a day (BID) | ORAL | Status: DC

## 2013-08-04 NOTE — Patient Instructions (Addendum)
Rocephin  one gram IM given. Take Doxycycline 100 mg bid x 10 days. Take Zyrtec for hives. Please call allergist for evaluation.

## 2013-08-04 NOTE — Progress Notes (Signed)
   Subjective:    Patient ID: Wendy Terrell, female    DOB: 1985-06-01, 28 y.o.   MRN: [id number]  HPI It has been a bout a month since she was here with respiratory infection. Has another respiratory infection and left ear pain. Started out with sore throat going into coughing ear pain. Mother has had similar illness. Her husband has had a similar illness. Son has been sick in the past month as well. Also recently began to wake up with welt like lesions on her face. This is a new problem. No trouble breathing. Has never been allergy tested.    Review of Systems     Objective:   Physical Exam Left TM is red centrally and retracted. Right TM full but not red. Pharynx is slightly injected without exudate. Neck is supple. Chest clear without rales or wheezing.       Assessment & Plan:  Acute URI  Acute left otitis media  ? Urticaria-no evidence of urticaria at present time  Plan: Doxycycline 100 mg twice daily for 10 days. Rocephin 1 g IM which he thought helped her a great deal last time. May take Zyrtec at bedtime for urticaria. I have recommended allergy testing and given her name of allergist to call for appointment.

## 2013-09-23 ENCOUNTER — Other Ambulatory Visit: Payer: Self-pay | Admitting: Internal Medicine

## 2013-10-05 ENCOUNTER — Other Ambulatory Visit: Payer: Self-pay | Admitting: Internal Medicine

## 2013-11-11 NOTE — Progress Notes (Signed)
   Subjective:    Patient ID: Wendy Terrell, female    DOB: [birth year], 28 y.o.   MRN: [id number]  HPI 28 year old white female with history of morbid obesity in today for health maintenance exam and evaluation of medical issues. History of hypothyroidism, depression, constipation. History of binge eating. Previous weight January 2014 was 329 pounds. Now weighs 324 pounds. Has had counseling regarding her weight.   Past medical history: Has GYN physician in [place]. Has ParaGard IUD. 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 Followed by dermatologist.  Social history: Married. Has one son. High school graduate. Long-standing history of obesity. Has consider bariatric surgery several years ago. 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  No known drug allergies.  Family history: Complications of heart disease and had valve replacement complications. He had history of Hodgkin's disease at an early age. He apparently had history of melanoma. Patient says half-sister has history of melanoma. Mother with history of hypothyroidism, diabetes, morbid obesity. Maternal uncle with history of diabetes and chronic kidney disease related to diabetes out of control. Maternal grandparents with diabetes mellitus and hypertension. Paternal grandfather had CABG and heart failure. Maternal grandmother was on dialysis.         Review of Systems  Constitutional: Positive for fatigue.  Eyes: Negative.   Respiratory: Negative.   Cardiovascular: Negative.   Neurological: Negative.   Hematological: Negative.    Considerable issues with recurrent bouts of otitis media recently. Says ears do not feel right at this point in time. History of constipation. History of depression treated with Wellbutrin.     Objective:   Physical Exam  Vitals reviewed. Constitutional: She appears well-developed and well-nourished. No distress.  HENT:  Head: Normocephalic and atraumatic.  Mouth/Throat: Oropharynx is  clear and moist. No oropharyngeal exudate.  Left otitis media. Right serous otitis media  Eyes: Conjunctivae are normal. Right eye exhibits no discharge. No scleral icterus.  Neck: Neck supple. No JVD present. No thyromegaly present.  Cardiovascular: Normal rate, regular rhythm and normal heart sounds.   No murmur heard. Pulmonary/Chest: Effort normal and breath sounds normal. No respiratory distress. She has no wheezes. She has no rales.  Breasts normal female  Abdominal: Soft. Bowel sounds are normal. She exhibits no distension and no mass. There is no tenderness. There is no rebound and no guarding.  Genitourinary:  Deferred to GYN  Musculoskeletal: Normal range of motion. She exhibits no edema.  Lymphadenopathy:    She has no cervical adenopathy.  Neurological: She is alert. She has normal reflexes. No cranial nerve deficit. Coordination normal.  Skin: Skin is warm and dry. No rash noted. She is not diaphoretic.  Psychiatric: She has a normal mood and affect. Her behavior is normal. Judgment and thought content normal.          Assessment & Plan:   Morbid obesity-patient would be a candidate for bariatric surgery should she decide to pursue this. Doesn't seem to be motivated to diet, exercise and lose weight  Hypothyroidism-stable  History of depression-stable on Wellbutrin  Recurrent bouts of otitis media-treated earlier this month with Levaquin and is still symptomatic. Depo-Medrol 80 mg IM today. Refill Levaquin 500 mg daily for 10 days.  Plan:

## 2013-11-24 ENCOUNTER — Ambulatory Visit: Payer: BC Managed Care – PPO | Admitting: Internal Medicine

## 2013-11-30 ENCOUNTER — Ambulatory Visit (INDEPENDENT_AMBULATORY_CARE_PROVIDER_SITE_OTHER): Payer: BC Managed Care – PPO | Admitting: Internal Medicine

## 2013-11-30 ENCOUNTER — Encounter: Payer: Self-pay | Admitting: Internal Medicine

## 2013-11-30 VITALS — BP 118/84 | HR 72 | Temp 97.7°F | Wt 328.0 lb

## 2013-11-30 DIAGNOSIS — F32A Depression, unspecified: Secondary | ICD-10-CM

## 2013-11-30 DIAGNOSIS — F329 Major depressive disorder, single episode, unspecified: Secondary | ICD-10-CM

## 2013-11-30 DIAGNOSIS — F3289 Other specified depressive episodes: Secondary | ICD-10-CM

## 2013-11-30 DIAGNOSIS — E039 Hypothyroidism, unspecified: Secondary | ICD-10-CM

## 2013-11-30 NOTE — Patient Instructions (Signed)
Continue same meds and return in 6 months. Check into appt with Obesity Clinic at [facility]. Diet and exercise. CPE in 6 months. Return to counseling.

## 2013-11-30 NOTE — Progress Notes (Signed)
   Subjective:    Patient ID: Wendy Terrell, female    DOB: Mar 16, 1986, 28 y.o.   MRN: [id number]  HPI  In today to follow up on depression and hypothyroidism. Says she and husband are under financial stress. She has been depressed and eatimng fast food x 2 days. Has gained 4 pounds since last few office visits. Family history of DM and obesity. Has been to multiple counselors. Has considered gastric by- pass surgery but cannot quite make up her mind about it. Always looking for a new diet.    Review of Systems     Objective:   Physical Exam  Neck is supple without  thyromegaly. Crying in office today. Spent 25 minutes with her discussing weight, depression and will power. Made suggestions for counseling and obesity clinic at [facility] or [place].      Assessment & Plan:  Morbid Obesity Depresion Hypothyroidism  These are longstanding issues that are going to require some work by the patient. Options reviewed. Continue same meds TSH drawn. RTC in 6 months for CPE.

## 2013-12-01 LAB — TSH: TSH: 3.006 u[IU]/mL (ref 0.350–4.500)

## 2014-01-08 ENCOUNTER — Telehealth: Payer: Self-pay | Admitting: Internal Medicine

## 2014-01-08 NOTE — Telephone Encounter (Signed)
She should call ENT for advice since he has been seeing her.

## 2014-01-08 NOTE — Telephone Encounter (Signed)
Spoke with patient to advise that she should contact Dr. Theressa Millard office since he prescribed the Flonase.  And, if the tubes aren't being effective, his office needs to be made aware.  Patient verbalized understanding of these instructions.

## 2014-01-08 NOTE — Telephone Encounter (Signed)
Ear tubes put in 9/9 by Dr. Simeon Craft.  He put her on Flonase at that time as well.  This a.m., she is congested (sounds like it's all in her "head", sore throat.  No fever.  Coughing is productive, but clear.  She was outside yesterday x 1 hour and was immediately congested.  She knows it's allergies; what can she take?  States she doesn't want to be sick up til Christmas like last year.  States she isn't on anything for allergies except the Flonase.  Please advise.

## 2014-02-13 ENCOUNTER — Ambulatory Visit (INDEPENDENT_AMBULATORY_CARE_PROVIDER_SITE_OTHER): Payer: BC Managed Care – PPO | Admitting: Internal Medicine

## 2014-02-13 DIAGNOSIS — Z23 Encounter for immunization: Secondary | ICD-10-CM

## 2014-04-07 ENCOUNTER — Other Ambulatory Visit: Payer: Self-pay | Admitting: Internal Medicine

## 2014-05-16 ENCOUNTER — Other Ambulatory Visit: Payer: Self-pay | Admitting: Internal Medicine

## 2014-05-29 ENCOUNTER — Other Ambulatory Visit: Payer: BLUE CROSS/BLUE SHIELD | Admitting: Internal Medicine

## 2014-05-29 DIAGNOSIS — Z13220 Encounter for screening for lipoid disorders: Secondary | ICD-10-CM

## 2014-05-29 DIAGNOSIS — Z130 Encounter for screening for diseases of the blood and blood-forming organs and certain disorders involving the immune mechanism: Secondary | ICD-10-CM

## 2014-05-29 DIAGNOSIS — Z1321 Encounter for screening for nutritional disorder: Secondary | ICD-10-CM

## 2014-05-29 DIAGNOSIS — E039 Hypothyroidism, unspecified: Secondary | ICD-10-CM

## 2014-05-29 DIAGNOSIS — Z0000 Encounter for general adult medical examination without abnormal findings: Secondary | ICD-10-CM

## 2014-05-29 LAB — CBC WITH DIFFERENTIAL/PLATELET
Basophils Absolute: 0 10*3/uL (ref 0.0–0.1)
Basophils Relative: 0 % (ref 0–1)
Eosinophils Absolute: 0.1 10*3/uL (ref 0.0–0.7)
Eosinophils Relative: 2 % (ref 0–5)
HCT: 41.1 % (ref 36.0–46.0)
Hemoglobin: 13.5 g/dL (ref 12.0–15.0)
Lymphocytes Relative: 29 % (ref 12–46)
Lymphs Abs: 1.9 10*3/uL (ref 0.7–4.0)
MCH: 27.8 pg (ref 26.0–34.0)
MCHC: 32.8 g/dL (ref 30.0–36.0)
MCV: 84.6 fL (ref 78.0–100.0)
MONO ABS: 0.4 10*3/uL (ref 0.1–1.0)
MPV: 11.3 fL (ref 8.6–12.4)
Monocytes Relative: 6 % (ref 3–12)
Neutro Abs: 4.2 10*3/uL (ref 1.7–7.7)
Neutrophils Relative %: 63 % (ref 43–77)
PLATELETS: 260 10*3/uL (ref 150–400)
RBC: 4.86 MIL/uL (ref 3.87–5.11)
RDW: 13.5 % (ref 11.5–15.5)
WBC: 6.7 10*3/uL (ref 4.0–10.5)

## 2014-05-29 LAB — COMPREHENSIVE METABOLIC PANEL
ALT: 17 U/L (ref 0–35)
AST: 19 U/L (ref 0–37)
Albumin: 3.9 g/dL (ref 3.5–5.2)
Alkaline Phosphatase: 81 U/L (ref 39–117)
BUN: 7 mg/dL (ref 6–23)
CO2: 29 meq/L (ref 19–32)
Calcium: 9.2 mg/dL (ref 8.4–10.5)
Chloride: 105 mEq/L (ref 96–112)
Creat: 0.61 mg/dL (ref 0.50–1.10)
GLUCOSE: 74 mg/dL (ref 70–99)
POTASSIUM: 4.4 meq/L (ref 3.5–5.3)
Sodium: 140 mEq/L (ref 135–145)
TOTAL PROTEIN: 6.5 g/dL (ref 6.0–8.3)
Total Bilirubin: 0.3 mg/dL (ref 0.2–1.2)

## 2014-05-29 LAB — LIPID PANEL
CHOL/HDL RATIO: 3.9 ratio
Cholesterol: 185 mg/dL (ref 0–200)
HDL: 48 mg/dL (ref >39)
LDL CALC: 121 mg/dL — AB (ref 0–99)
TRIGLYCERIDES: 80 mg/dL (ref <150)
VLDL: 16 mg/dL (ref 0–40)

## 2014-05-30 LAB — VITAMIN D 25 HYDROXY (VIT D DEFICIENCY, FRACTURES): Vit D, 25-Hydroxy: 26 ng/mL — ABNORMAL LOW (ref 30–100)

## 2014-05-30 LAB — TSH: TSH: 2.925 u[IU]/mL (ref 0.350–4.500)

## 2014-06-05 ENCOUNTER — Ambulatory Visit (INDEPENDENT_AMBULATORY_CARE_PROVIDER_SITE_OTHER): Payer: BLUE CROSS/BLUE SHIELD | Admitting: Internal Medicine

## 2014-06-05 ENCOUNTER — Encounter: Payer: Self-pay | Admitting: Internal Medicine

## 2014-06-05 VITALS — BP 128/76 | HR 78 | Temp 97.4°F | Wt 339.0 lb

## 2014-06-05 DIAGNOSIS — Z0000 Encounter for general adult medical examination without abnormal findings: Secondary | ICD-10-CM

## 2014-06-05 DIAGNOSIS — E7800 Pure hypercholesterolemia, unspecified: Secondary | ICD-10-CM

## 2014-06-05 DIAGNOSIS — E559 Vitamin D deficiency, unspecified: Secondary | ICD-10-CM

## 2014-06-05 DIAGNOSIS — Z833 Family history of diabetes mellitus: Secondary | ICD-10-CM

## 2014-06-05 DIAGNOSIS — Z8659 Personal history of other mental and behavioral disorders: Secondary | ICD-10-CM

## 2014-06-05 DIAGNOSIS — H66006 Acute suppurative otitis media without spontaneous rupture of ear drum, recurrent, bilateral: Secondary | ICD-10-CM

## 2014-06-05 DIAGNOSIS — E039 Hypothyroidism, unspecified: Secondary | ICD-10-CM

## 2014-06-05 DIAGNOSIS — Z85820 Personal history of malignant melanoma of skin: Secondary | ICD-10-CM

## 2014-06-05 LAB — POCT URINALYSIS DIPSTICK
Bilirubin, UA: NEGATIVE
Blood, UA: NEGATIVE
GLUCOSE UA: NEGATIVE
Ketones, UA: NEGATIVE
LEUKOCYTES UA: NEGATIVE
NITRITE UA: NEGATIVE
Protein, UA: NEGATIVE
Spec Grav, UA: 1.015
Urobilinogen, UA: NEGATIVE
pH, UA: 7.5

## 2014-06-05 NOTE — Progress Notes (Signed)
   Subjective:    Patient ID: Wendy Terrell, female    DOB: 10-09-1985, 29 y.o.   MRN: [id number]  HPI  29 year old White Female in today for health maintenance and evaluation of medical issues including morbid obesity, depression, hypothyroidism. She is considering having another child.  She has a GYN in [place]. History of ParaGard IUD. History of melanoma on her back and atypical nevi followed by dermatologist.  History of morbid obesity. Previous weight January 2014 was 329 pounds, in January 2015, she weighed 324 pounds. Today weighs 339 pounds. Says she's been trying to exercise. Her maternal uncle and grandparents were overweight. Her mother has morbid obesity.  Social history: She is married and has one son. High school graduate. 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  Family history: Father died of complications of heart disease and had valve replacement complications. He had a history of Hodgkin's disease at an early age and apparent history of melanoma. Patient says half sister has history of melanoma. Mother with history of hypothyroidism, diabetes, morbid obesity. Maternal uncle with diabetes and chronic kidney disease related to diabetes not well controlled. Maternal grandparents with diabetes mellitus and hypertension. Paternal grandfather had CABG and heart failure. Maternal grandmother was on dialysis.  No known drug allergies  Seems happier than in the past. Has been to counseling in the past for depression particularly after death of her father.      Review of Systems History of recurrent bouts of otitis media. History of depression treated with Wellbutrin. History of constipation.     Objective:   Physical Exam  Constitutional: She is oriented to person, place, and time. She appears well-developed and well-nourished. No distress.  HENT:  Head: Normocephalic and atraumatic.  Right Ear: External ear normal.  Left Ear: External ear normal.  Mouth/Throat:  Oropharynx is clear and moist. No oropharyngeal exudate.  Eyes: Conjunctivae and EOM are normal. Pupils are equal, round, and reactive to light. Right eye exhibits no discharge. Left eye exhibits no discharge.  Neck: Neck supple. No JVD present. No thyromegaly present.  Cardiovascular: Normal rate, regular rhythm, normal heart sounds and intact distal pulses.   No murmur heard. Pulmonary/Chest: Effort normal and breath sounds normal. She has no wheezes. She has no rales. She exhibits no tenderness.  Abdominal: Soft. Bowel sounds are normal. She exhibits no distension and no mass. There is no tenderness. There is no rebound and no guarding.  Genitourinary:  Deferred to GYN  Musculoskeletal: She exhibits no edema.  Lymphadenopathy:    She has no cervical adenopathy.  Neurological: She is alert and oriented to person, place, and time. She has normal reflexes. No cranial nerve deficit. Coordination normal.  Skin: Skin is warm and dry. No rash noted. She is not diaphoretic.  Psychiatric: She has a normal mood and affect. Her behavior is normal. Judgment and thought content normal.  Vitals reviewed.         Assessment & Plan:  Hypothyroidism-stable on thyroid replacement therapy  Morbid obesity encouraged diet and exercise. She is considering another pregnancy and currently not willing to consider weight loss surgery.  History depression stable on Wellbutrin  Elevated LDL cholesterol needs to be followed  History of vitamin D deficiency-recommend 2000 units vitamin D 3 daily  Plan: Return in 6 months or as needed.

## 2014-07-25 DIAGNOSIS — E7800 Pure hypercholesterolemia, unspecified: Secondary | ICD-10-CM | POA: Insufficient documentation

## 2014-07-25 DIAGNOSIS — E559 Vitamin D deficiency, unspecified: Secondary | ICD-10-CM | POA: Insufficient documentation

## 2014-07-25 DIAGNOSIS — Z85820 Personal history of malignant melanoma of skin: Secondary | ICD-10-CM | POA: Insufficient documentation

## 2014-07-25 DIAGNOSIS — Z833 Family history of diabetes mellitus: Secondary | ICD-10-CM | POA: Insufficient documentation

## 2014-07-25 NOTE — Patient Instructions (Signed)
Encouraged diet exercise and weight loss prickly she is considering another pregnancy. Return in 6 months. At next visit need to check fasting lipid panel, TSH, hemoglobin A1c

## 2014-10-23 ENCOUNTER — Other Ambulatory Visit: Payer: Self-pay | Admitting: Internal Medicine

## 2014-11-02 ENCOUNTER — Other Ambulatory Visit: Payer: Self-pay | Admitting: Internal Medicine

## 2014-12-11 ENCOUNTER — Other Ambulatory Visit: Payer: BLUE CROSS/BLUE SHIELD | Admitting: Internal Medicine

## 2014-12-13 ENCOUNTER — Ambulatory Visit: Payer: BLUE CROSS/BLUE SHIELD | Admitting: Internal Medicine

## 2014-12-14 ENCOUNTER — Ambulatory Visit (INDEPENDENT_AMBULATORY_CARE_PROVIDER_SITE_OTHER): Payer: BLUE CROSS/BLUE SHIELD | Admitting: Internal Medicine

## 2014-12-14 ENCOUNTER — Encounter: Payer: Self-pay | Admitting: Internal Medicine

## 2014-12-14 ENCOUNTER — Other Ambulatory Visit: Payer: Self-pay | Admitting: Internal Medicine

## 2014-12-14 VITALS — BP 124/84 | HR 58 | Temp 97.6°F | Wt 332.0 lb

## 2014-12-14 DIAGNOSIS — E039 Hypothyroidism, unspecified: Secondary | ICD-10-CM | POA: Diagnosis not present

## 2014-12-14 DIAGNOSIS — R5383 Other fatigue: Secondary | ICD-10-CM | POA: Diagnosis not present

## 2014-12-14 DIAGNOSIS — Z8659 Personal history of other mental and behavioral disorders: Secondary | ICD-10-CM | POA: Diagnosis not present

## 2014-12-14 NOTE — Patient Instructions (Signed)
Thyroid functions pending. Add hemoglobin A1c. Return in 6 months for physical exam unless pregnant

## 2014-12-14 NOTE — Progress Notes (Signed)
   Subjective:    Patient ID: Wendy Terrell, female    DOB: 1985/09/28, 29 y.o.   MRN: [id number]  HPI In today to follow-up on hypothyroidism. TSH drawn and pending. Compliant with thyroid replacement medication. Previous weight February 2016 339 pounds. Now weighs 332 pounds. Says that she feels tired and sleepy all the time. One day slip 18 hours while on vacation. Not aware of any sleep apnea issues. Has never had formal sleep study. Trying to conceive. Has appointment with GYN next month. Is disappointed that she has not conceived as they have been trying since November 2015. Denies being significantly depressed. Not down in the adult but not happy either. Long-standing issues of dysthymia. Remains on Wellbutrin for depression.   Review of Systems     Objective:   Physical Exam  No thyromegaly. Chest clear. Cardiac exam regular rate and rhythm. Extremities without edema      Assessment & Plan:  Hypothyroidism-TSH pending  Morbid obesity-this has been discussed at length in the past and she simply is not motivated to diet and exercise  Excessive daytime sleepiness-may have sleep apnea  History of depression-treated with Wellbutrin and counseling  Plan: If thyroid functions are normal, refer for sleep study. Also check for diabetes mellitus since this runs in her family. Continue Wellbutrin.

## 2014-12-15 LAB — TSH: TSH: 3.664 u[IU]/mL (ref 0.350–4.500)

## 2014-12-17 ENCOUNTER — Telehealth: Payer: Self-pay | Admitting: *Deleted

## 2014-12-17 LAB — HEMOGLOBIN A1C
HEMOGLOBIN A1C: 5.3 % (ref <5.7)
Mean Plasma Glucose: 105 mg/dL (ref <117)

## 2014-12-17 MED ORDER — LEVOTHYROXINE SODIUM 88 MCG PO TABS: 88.0000 ug | ORAL_TABLET | Freq: Every day | ORAL | Status: DC

## 2014-12-17 NOTE — Telephone Encounter (Signed)
Reviewed labs and instructions with patient regarding increase in synthroid appt schedule for 6 week follow up and repeat TSH

## 2015-01-14 ENCOUNTER — Ambulatory Visit: Payer: Self-pay | Admitting: Internal Medicine

## 2015-01-21 ENCOUNTER — Encounter: Payer: Self-pay | Admitting: Internal Medicine

## 2015-01-21 ENCOUNTER — Ambulatory Visit (INDEPENDENT_AMBULATORY_CARE_PROVIDER_SITE_OTHER): Payer: BLUE CROSS/BLUE SHIELD | Admitting: Internal Medicine

## 2015-01-21 VITALS — BP 118/82 | HR 65 | Temp 98.4°F | Wt 338.0 lb

## 2015-01-21 DIAGNOSIS — F329 Major depressive disorder, single episode, unspecified: Secondary | ICD-10-CM | POA: Diagnosis not present

## 2015-01-21 DIAGNOSIS — Z23 Encounter for immunization: Secondary | ICD-10-CM | POA: Diagnosis not present

## 2015-01-21 DIAGNOSIS — E039 Hypothyroidism, unspecified: Secondary | ICD-10-CM | POA: Diagnosis not present

## 2015-01-21 DIAGNOSIS — F32A Depression, unspecified: Secondary | ICD-10-CM

## 2015-01-21 LAB — TSH: TSH: 1.914 u[IU]/mL (ref 0.350–4.500)

## 2015-01-21 NOTE — Progress Notes (Signed)
   Subjective:    Patient ID: Wendy Terrell, female    DOB: 12-01-1985, 28 y.o.   MRN: [id number]  HPI At last visit thyroid replacement medication dose was adjusted. TSH was 3.664. She was changed to Synthroid 0.088 mg daily. She is trying to get pregnant. She is here for follow-up. Feeling a bit depressed. Takes antidepressives and medication. Thinks is because days or gloomy sometimes. 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 Flu vaccine given.    Review of Systems     Objective:   Physical Exam  No thyromegaly      Assessment & Plan:  Hypothyroidism  Morbid obesity  Seasonal affective disorder  Depression  Plan: TSH drawn and pending. Flu vaccine given. Physical exam due after 06/06/2015

## 2015-01-21 NOTE — Patient Instructions (Signed)
TSH drawn and pending. Further instructions to follow. Flu vaccine given. Physical exam due after 06/06/2015

## 2015-02-20 ENCOUNTER — Other Ambulatory Visit: Payer: Self-pay | Admitting: Internal Medicine

## 2015-03-04 ENCOUNTER — Ambulatory Visit (INDEPENDENT_AMBULATORY_CARE_PROVIDER_SITE_OTHER): Payer: Self-pay | Admitting: Internal Medicine

## 2015-03-04 ENCOUNTER — Encounter: Payer: Self-pay | Admitting: Internal Medicine

## 2015-03-04 VITALS — BP 112/70 | HR 67 | Temp 97.9°F | Resp 18 | Ht 68.0 in | Wt 351.0 lb

## 2015-03-04 DIAGNOSIS — N926 Irregular menstruation, unspecified: Secondary | ICD-10-CM

## 2015-03-04 DIAGNOSIS — J029 Acute pharyngitis, unspecified: Secondary | ICD-10-CM

## 2015-03-04 LAB — POCT RAPID STREP A (OFFICE): RAPID STREP A SCREEN: NEGATIVE

## 2015-03-04 MED ORDER — AZITHROMYCIN 250 MG PO TABS: ORAL_TABLET | ORAL | Status: DC

## 2015-03-04 NOTE — Progress Notes (Signed)
   Subjective:    Patient ID: Wendy Terrell, female    DOB: Apr 29, 1985, 29 y.o.   MRN: [id number]  HPI Onset yesterday of sore throat and left ear discomfort. She has bilateral grommet tubes placed about a year ago. No fever or chills. Son has had a runny nose. Patient has been trying to conceive. She says she does not recall having had a menstrual period in October. We drew a quantitative serum hCG today.    Review of Systems     Objective:   Physical Exam  Pharynx is slightly injected. Rapid strep screen negative. TMs are chronically scarred and bilateral grommet tubes were in place without drainage. Neck is supple without significant adenopathy. Chest clear to auscultation.      Assessment & Plan:  Acute pharyngitis  Possible pregnancy  Plan: Zithromax Z-PAK take 2 day one followed by 1 by mouth days 2 through 5. This will be safe with pregnancy. Serum hCG is pending.

## 2015-03-04 NOTE — Patient Instructions (Signed)
Serum hCG is pending. Take Zithromax Z-PAK as prescribed.

## 2015-03-05 LAB — HCG, QUANTITATIVE, PREGNANCY: hCG, Beta Chain, Quant, S: <2 m[IU]/mL

## 2015-05-31 ENCOUNTER — Other Ambulatory Visit: Payer: Self-pay | Admitting: Internal Medicine

## 2015-05-31 NOTE — Telephone Encounter (Signed)
I understand pt is pregnant. She needs to contact OB about this may need to discontinue

## 2015-05-31 NOTE — Telephone Encounter (Signed)
Left message for return call.

## 2015-06-04 NOTE — Telephone Encounter (Signed)
As I said in last note, please call her and ask her to check with OB. She is pregnant and they may not want her to continue this

## 2015-06-04 NOTE — Telephone Encounter (Signed)
Patient returning the call; states that her OB/GYN advised to continue the Wellbutrin.  However, patient didn't feel comfortable being on the Wellbutrin XL.  So, she took herself off of the medication Friday, 05/31/15.  States she has had a lot of morning sickness.  States that she didn't taper off of it; she took it a couple of days and was sick to her stomach and then she just stopped taking it.  States that she, her husband and her Mom have noticed a marked difference in her mood since stopping the medication (for the better).  She will discuss it with you during her upcoming office visit.  She doesn't wish to continue taking the medication.

## 2015-06-04 NOTE — Telephone Encounter (Signed)
Refill denied and sent back to pharmacy with note that pt is no longer taking.

## 2015-06-18 ENCOUNTER — Ambulatory Visit (INDEPENDENT_AMBULATORY_CARE_PROVIDER_SITE_OTHER): Payer: BLUE CROSS/BLUE SHIELD | Admitting: Internal Medicine

## 2015-06-18 ENCOUNTER — Encounter: Payer: Self-pay | Admitting: Internal Medicine

## 2015-06-18 VITALS — BP 112/76 | HR 70 | Temp 98.4°F | Resp 20 | Wt 340.0 lb

## 2015-06-18 DIAGNOSIS — R7989 Other specified abnormal findings of blood chemistry: Secondary | ICD-10-CM

## 2015-06-18 DIAGNOSIS — R112 Nausea with vomiting, unspecified: Secondary | ICD-10-CM

## 2015-06-18 DIAGNOSIS — R946 Abnormal results of thyroid function studies: Secondary | ICD-10-CM

## 2015-06-18 DIAGNOSIS — E039 Hypothyroidism, unspecified: Secondary | ICD-10-CM

## 2015-06-18 LAB — POCT URINALYSIS DIPSTICK
Bilirubin, UA: NEGATIVE
Blood, UA: NEGATIVE
Glucose, UA: NEGATIVE
Ketones, UA: NEGATIVE
LEUKOCYTES UA: NEGATIVE
Nitrite, UA: NEGATIVE
PROTEIN UA: NEGATIVE
SPEC GRAV UA: 1.02
UROBILINOGEN UA: 0.2
pH, UA: 7.5

## 2015-06-18 NOTE — Patient Instructions (Signed)
Free T4 and TSH drawn and pending. Further recommendations to follow based on these results.

## 2015-06-18 NOTE — Progress Notes (Signed)
   Subjective:    Patient ID: Rosana Hoes, female    DOB: 10/31/1985, 30 y.o.   MRN: [id number]  HPI 30 year old White Female who is currently [redacted] weeks pregnant and seeing OB/GYN in [place]. She has a history of hypothyroidism and morbid obesity. Lab work done recently at OB/GYN showed elevated TSH at 4.170. She is currently on levothyroxine 0.088 mg daily with compliance. She's had some morning sickness. She has fatigue. We originally had her scheduled for physical examination prior to her pregnancy. We have deferred that for now. GYN wants [place] to continue to regulate  thyroid replacement medication dosage. Recently had hemoglobin A1c done through OB/GYN which was 5.4%.   Review of Systems     Objective:   Physical Exam  Not examined. Free T4 and TSH drawn.      Assessment & Plan:  Hypothyroidism  Morbid obesity  Hyperemesis gravidarum  Plan: Free T4 and TSH drawn today. Further recommendations to follow. We'll likely want to recheck TSH in 6 weeks to 3 months depending on these results.

## 2015-06-19 LAB — T4, FREE: Free T4: 1.2 ng/dL (ref 0.8–1.8)

## 2015-06-19 LAB — TSH: TSH: 2.48 m[IU]/L

## 2015-07-12 ENCOUNTER — Other Ambulatory Visit: Payer: Self-pay | Admitting: Internal Medicine

## 2015-09-09 ENCOUNTER — Ambulatory Visit: Payer: BLUE CROSS/BLUE SHIELD | Admitting: Internal Medicine

## 2015-09-13 ENCOUNTER — Other Ambulatory Visit: Payer: Self-pay | Admitting: Internal Medicine

## 2015-09-16 ENCOUNTER — Encounter: Payer: Self-pay | Admitting: Internal Medicine

## 2015-09-16 ENCOUNTER — Ambulatory Visit (INDEPENDENT_AMBULATORY_CARE_PROVIDER_SITE_OTHER): Payer: BLUE CROSS/BLUE SHIELD | Admitting: Internal Medicine

## 2015-09-16 VITALS — BP 127/84 | HR 87 | Temp 98.3°F | Resp 18 | Ht 68.0 in | Wt 315.0 lb

## 2015-09-16 DIAGNOSIS — E039 Hypothyroidism, unspecified: Secondary | ICD-10-CM | POA: Diagnosis not present

## 2015-09-16 DIAGNOSIS — J069 Acute upper respiratory infection, unspecified: Secondary | ICD-10-CM | POA: Diagnosis not present

## 2015-09-16 DIAGNOSIS — Z3490 Encounter for supervision of normal pregnancy, unspecified, unspecified trimester: Secondary | ICD-10-CM

## 2015-09-16 DIAGNOSIS — H6502 Acute serous otitis media, left ear: Secondary | ICD-10-CM

## 2015-09-16 DIAGNOSIS — Z331 Pregnant state, incidental: Secondary | ICD-10-CM | POA: Diagnosis not present

## 2015-09-16 LAB — POCT RAPID STREP A (OFFICE): RAPID STREP A SCREEN: NEGATIVE

## 2015-09-16 LAB — TSH: TSH: 2.15 m[IU]/L

## 2015-09-16 MED ORDER — AZITHROMYCIN 250 MG PO TABS: ORAL_TABLET | ORAL | Status: DC

## 2015-09-16 NOTE — Patient Instructions (Signed)
Zithromax Z-PAK as directed. Take 2 tablets day one followed by 1 tablet days 2 through 5. TSH drawn and pending at [redacted] weeks pregnant. Rest and drink plenty of fluids.

## 2015-09-16 NOTE — Progress Notes (Signed)
   Subjective:    Patient ID: Wendy Terrell, female    DOB: [birth year], 30 y.o.   MRN: [id number]  HPI Patient in today to follow-up on hypothyroidism. Currently on levothyroxine 0.88 mg daily. She is [redacted] weeks pregnant. Due around September 18. Has come down with acute upper respiratory infection. Has had issues with morning sickness and normal virus. This actually lost weight over the past few months. In September weighed 338 pounds, in November weighed 351 pounds and in February weighed 340 pounds. Has not been really trying to diet but has had lack of appetite with numerous illnesses. Complains of sore throat and ear pain. Has grommet tube in right ear. Says she has been checked by OB for diabetes and was negative. OB is in [place] and she plans to have the baby there. Complaining of cough and rhinorrhea. No fever or shaking chills. Has malaise and fatigue.    Review of Systems     Objective:   Physical Exam Skin warm and dry. Nodes none. No thyromegaly. No cervical adenopathy. Pharynx is red. Rapid strep screen negative. Left TM is full but not red. Right TM no drainage and grommet tube in place. Neck is supple. Chest clear to auscultation.       Assessment & Plan:  Acute pharyngitis  Left serous otitis media  Acute URI  Hypothyroidism  [redacted] weeks pregnant  Plan: TSH drawn and pending. Further recommendations to follow regarding hypothyroidism. Zithromax Z-PAK take 2 tablets day one followed by 1 tablet days 2 through 5. May take Tylenol or Robitussin.  25 minutes spent with patient

## 2015-10-13 ENCOUNTER — Other Ambulatory Visit: Payer: Self-pay | Admitting: Internal Medicine

## 2015-11-18 NOTE — Progress Notes (Signed)
Erroneous encounter

## 2015-12-09 ENCOUNTER — Encounter: Payer: Self-pay | Admitting: Internal Medicine

## 2015-12-09 ENCOUNTER — Ambulatory Visit (INDEPENDENT_AMBULATORY_CARE_PROVIDER_SITE_OTHER): Payer: BLUE CROSS/BLUE SHIELD | Admitting: Internal Medicine

## 2015-12-09 VITALS — BP 114/64 | HR 82 | Temp 97.6°F | Ht 68.0 in | Wt 326.0 lb

## 2015-12-09 DIAGNOSIS — E039 Hypothyroidism, unspecified: Secondary | ICD-10-CM | POA: Diagnosis not present

## 2015-12-09 DIAGNOSIS — F32A Depression, unspecified: Secondary | ICD-10-CM

## 2015-12-09 DIAGNOSIS — F329 Major depressive disorder, single episode, unspecified: Secondary | ICD-10-CM

## 2015-12-09 DIAGNOSIS — F418 Other specified anxiety disorders: Secondary | ICD-10-CM | POA: Diagnosis not present

## 2015-12-09 DIAGNOSIS — F419 Anxiety disorder, unspecified: Secondary | ICD-10-CM

## 2015-12-09 DIAGNOSIS — R002 Palpitations: Secondary | ICD-10-CM

## 2015-12-09 LAB — TSH: TSH: 2.03 mIU/L

## 2015-12-09 NOTE — Progress Notes (Signed)
   Subjective:    Patient ID: Wendy Terrell, female    DOB: Nov 09, 1985, 30 y.o.   MRN: [id number]  HPI   30 year old Female scheduled for C-section for September 12 in today to follow-up on hypothyroidism. Currently on levothyroxine 0.088 mg daily. Has been having some palpitations. Obstetrician is referring her to cardiologist and [facility] in the near future. No chest pain or shortness of breath. TSH drawn today.  Says she's had a lot of anxiety and depression with this pregnancy.This is her second pregnancy. She knows it's a boy. Says she's felt somewhat unprepared for the pregnancy. [place] car seat recently. Also worried about finances. Family is supportive.  Says she's had some Braxton Hicks contractions as well.  Asking about flu vaccine. We will defer that until after baby is born.  She says she recently had TD at vaccine and this is been recorded in her chart.    Review of Systems see above     Objective:   Physical Exam Skin warm and dry. Nodes none. No thyromegaly. Chest clear to auscultation. Cardiac exam regular rate and rhythm with very occasional irregular contraction. Affect today is cheerful.       Assessment & Plan:  Hypothyroidism-TSH pending  Intrauterine pregnancy with schedule C-section for September 12  Anxiety depression  Palpitations-to see cardiologist in the near future  Plan: She will return late October for follow-up TSH postpartum and receive flu vaccine. TSH pending today and will be mailed to her so she can pass it on to her obstetrician.

## 2015-12-09 NOTE — Patient Instructions (Signed)
It was a pleasure to see you today. TSH drawn and pending. Return late October for follow-up TSH postpartum and to receive flu vaccine.

## 2015-12-16 ENCOUNTER — Ambulatory Visit: Payer: BLUE CROSS/BLUE SHIELD | Admitting: Internal Medicine

## 2016-02-17 ENCOUNTER — Encounter: Payer: Self-pay | Admitting: Internal Medicine

## 2016-02-17 ENCOUNTER — Ambulatory Visit (INDEPENDENT_AMBULATORY_CARE_PROVIDER_SITE_OTHER): Payer: BLUE CROSS/BLUE SHIELD | Admitting: Internal Medicine

## 2016-02-17 VITALS — BP 128/84 | HR 66 | Temp 98.0°F | Wt 325.5 lb

## 2016-02-17 DIAGNOSIS — Z8659 Personal history of other mental and behavioral disorders: Secondary | ICD-10-CM

## 2016-02-17 DIAGNOSIS — Z23 Encounter for immunization: Secondary | ICD-10-CM | POA: Diagnosis not present

## 2016-02-17 DIAGNOSIS — E039 Hypothyroidism, unspecified: Secondary | ICD-10-CM | POA: Diagnosis not present

## 2016-02-17 LAB — TSH: TSH: 1.66 mIU/L

## 2016-02-17 NOTE — Patient Instructions (Addendum)
TSH drawn and pending. Flu vaccine given. Return in 6 months for physical examination and lab work. Continue Zoloft.

## 2016-02-17 NOTE — Progress Notes (Signed)
   Subjective:    Patient ID: Wendy Terrell, female    DOB: 04-06-1986, 30 y.o.   MRN: [id number]  HPI   30 year old Female for follow up of hypothyroidism. Recently delivered second child, a boy, born by repeat C-section. Doing well. Went back on ZoloftTo prevent possible postpartum depression. Long-standing history of depression and anxiety.  TSH  drawn today.  Flu vaccine given.  Is breast feeding without issues. Seems happy. 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        Review of Systems no new complaints     Objective:   Physical Exam  Skin warm and dry. Nodes none. No thyromegaly. Chest clear to auscultation. Cardiac exam regular rate and rhythm normal S1 and S2. Extremities without edema.      Assessment & Plan:  Hypothyroidism  History of depression  Plan: TSH drawn and pending with further recommendations to follow. Return in 6 months for physical exam assuming TSH is normal on current dose of thyroid replacement. Continue Zoloft for depression.  Flu vaccine given

## 2016-03-10 ENCOUNTER — Other Ambulatory Visit: Payer: Self-pay | Admitting: Internal Medicine

## 2016-08-13 ENCOUNTER — Other Ambulatory Visit: Payer: BLUE CROSS/BLUE SHIELD | Admitting: Internal Medicine

## 2016-08-18 ENCOUNTER — Encounter: Payer: BLUE CROSS/BLUE SHIELD | Admitting: Internal Medicine

## 2016-09-24 ENCOUNTER — Encounter: Payer: Self-pay | Admitting: Internal Medicine

## 2016-09-25 ENCOUNTER — Encounter: Payer: Self-pay | Admitting: Internal Medicine

## 2016-11-16 ENCOUNTER — Telehealth: Payer: Self-pay | Admitting: Internal Medicine

## 2016-11-16 NOTE — Telephone Encounter (Signed)
Patients Mom, Arva Chafe called for patient.  States that patients child is sick and she has to take the child to the pediatrician in the morning and that's the only appointment that she could get for the child tomorrow.  So, she needs to cancel her appointment for tomorrow.  Skyylar will call back and R/S her CPE/Labs at another time.    Thanked her for calling and canceling in advance.

## 2016-11-17 ENCOUNTER — Encounter: Payer: Self-pay | Admitting: Internal Medicine

## 2017-08-04 ENCOUNTER — Encounter: Payer: Self-pay | Admitting: Internal Medicine
